# Patient Record
Sex: Male | Born: 1990 | Race: White | Hispanic: No | Marital: Single
Health system: Southern US, Community
[De-identification: ages and names within clinical notes are randomized; demographics above are authoritative.]

---

## 2018-09-06 ENCOUNTER — Other Ambulatory Visit: Payer: Self-pay | Admitting: Family Medicine

## 2018-09-06 ENCOUNTER — Other Ambulatory Visit: Payer: Self-pay

## 2018-09-06 ENCOUNTER — Ambulatory Visit
Admission: RE | Admit: 2018-09-06 | Discharge: 2018-09-06 | Disposition: A | Discharge status: Home / Self Care | Payer: BLUE CROSS/BLUE SHIELD | Source: Ambulatory Visit | Attending: Family Medicine | Admitting: Family Medicine

## 2018-09-06 DIAGNOSIS — R0602 Shortness of breath: Secondary | ICD-10-CM

## 2018-09-06 MED ORDER — IOPAMIDOL (ISOVUE-370) INJECTION 76%
75.0000 mL | Freq: Once | INTRAVENOUS | Status: AC | PRN
  Administered 2018-09-06: 75 mL via INTRAVENOUS

## 2019-09-07 ENCOUNTER — Ambulatory Visit: Payer: Self-pay | Attending: Internal Medicine

## 2019-09-07 DIAGNOSIS — Z23 Encounter for immunization: Secondary | ICD-10-CM

## 2019-09-07 NOTE — Progress Notes (Signed)
   Covid-19 Vaccination Clinic  Name:  Todd Rios    MRN: [id number] DOB: October 08, 1990  09/07/2019  [name] was observed post Covid-19 immunization for 15 minutes without incident. He was provided with Vaccine Information Sheet and instruction to access the V-Safe system.   [name] was instructed to call 911 with any severe reactions post vaccine: Marland Kitchen Difficulty breathing  . Swelling of face and throat  . A fast heartbeat  . A bad rash all over body  . Dizziness and weakness   Immunizations Administered    Name Date Dose VIS Date Route   Pfizer COVID-19 Vaccine 09/07/2019  5:09 PM 0.3 mL 05/26/2019 Intramuscular   Manufacturer: [employer], [employer]   Lot: [id number]   NDC: [address]-7

## 2019-10-02 ENCOUNTER — Ambulatory Visit: Payer: Self-pay | Attending: Internal Medicine

## 2019-10-02 ENCOUNTER — Ambulatory Visit: Payer: Self-pay

## 2019-10-02 DIAGNOSIS — Z23 Encounter for immunization: Secondary | ICD-10-CM

## 2019-10-02 NOTE — Progress Notes (Signed)
   Covid-19 Vaccination Clinic  Name:  Amedee Cerrone    MRN: [id number] DOB: [birth year]  10/02/2019  Mr. Ramanathan was observed post Covid-19 immunization for 15 minutes without incident. He was provided with Vaccine Information Sheet and instruction to access the V-Safe system.   Mr. Attar was instructed to call 911 with any severe reactions post vaccine: Marland Kitchen Difficulty breathing  . Swelling of face and throat  . A fast heartbeat  . A bad rash all over body  . Dizziness and weakness   Immunizations Administered    Name Date Dose VIS Date Route   Pfizer COVID-19 Vaccine 10/02/2019  8:20 AM 0.3 mL 08/09/2018 Intramuscular   Manufacturer: [employer], [employer]   Lot: [id number]   NDC: [address]-3

## 2019-11-16 IMAGING — CT CT ANGIOGRAPHY CHEST
4 of 8 series · 19 of 36 positions shown · IV contrast (iopamidol)
Comparison: None.

CLINICAL DATA: Shortness of breath, no fever or cough

EXAM:
CT ANGIOGRAPHY CHEST WITH CONTRAST
TECHNIQUE: Multidetector CT imaging of the chest was performed using the
standard protocol during bolus administration of intravenous
contrast. Multiplanar CT image reconstructions and MIPs were
obtained to evaluate the vascular anatomy.
CONTRAST:  75mL MZASXL-5RY IOPAMIDOL (MZASXL-5RY) INJECTION 76%

[Series 5: cta pulmonary 2.00 bv36 s3 ax · axial · 0.74mm/px · z∈[+1325,+1443]mm · 2 of 179 slices shown]
[im 60/179  lung]
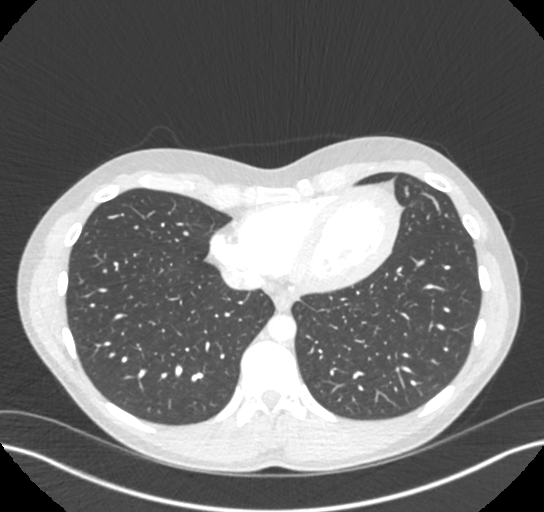
[im 119/179  lung]
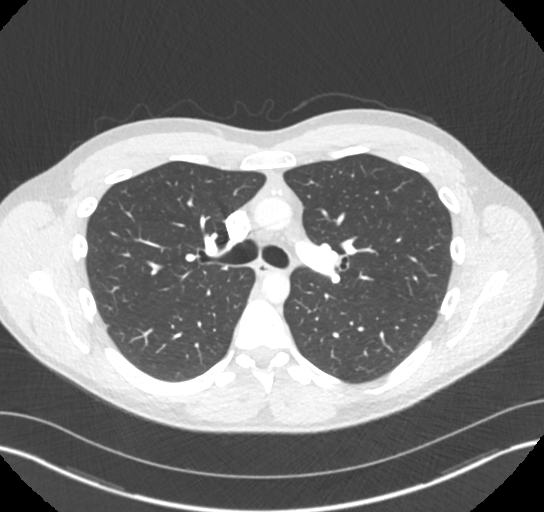

[Series 7: cta pulmonary 2.00 bv36 s3 cor · coronal · 0.70mm/px · 1 of 189 slices shown]
[im 95/189  mediastinal]
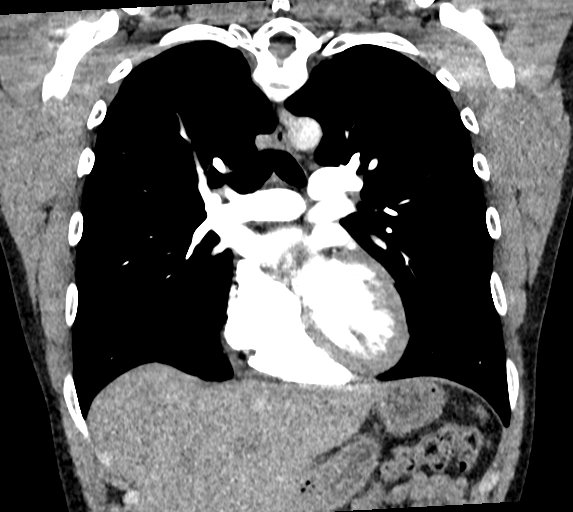

[Series 11: cta pulmonary 1.00 bv36 s3 thins · axial · 0.79mm/px · z∈[+1245,+1509]mm · 7 of 353 slices shown]
[im 45/353  lung]
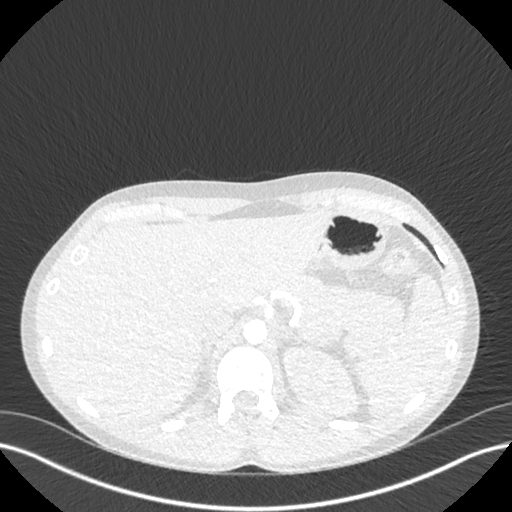
[im 89/353  lung]
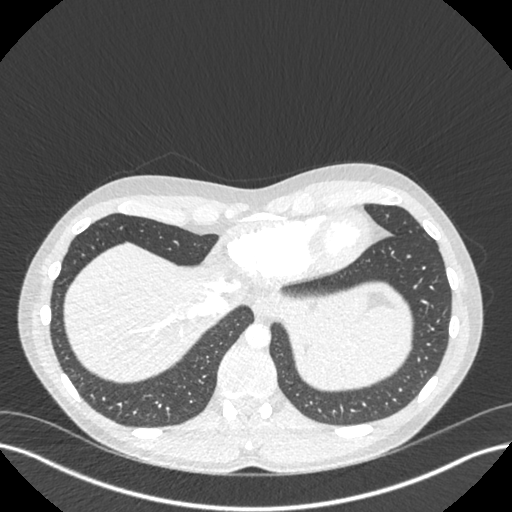
[im 133/353  lung]
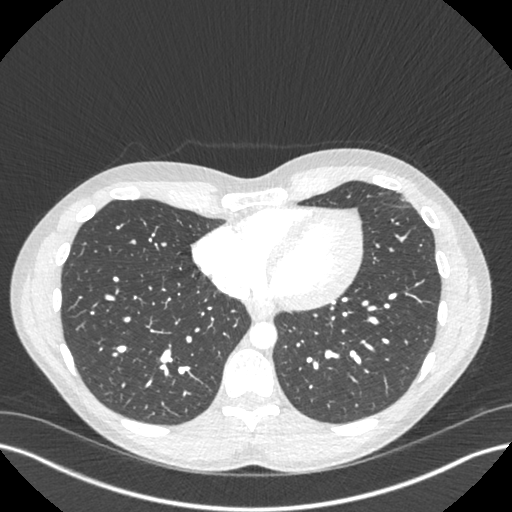
[im 177/353  lung]
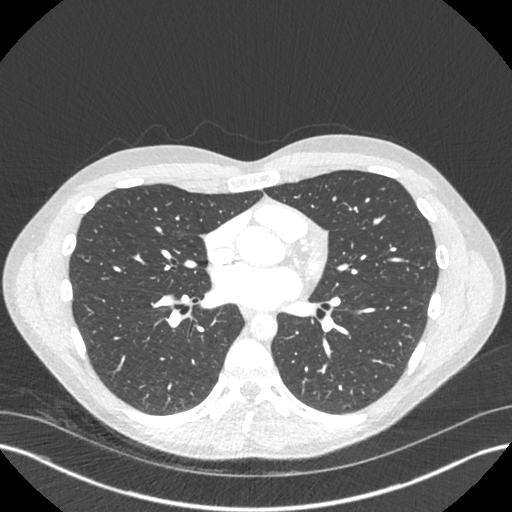
[im 221/353  lung]
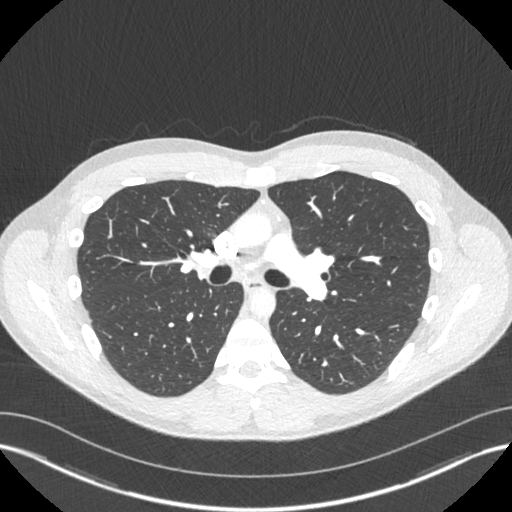
[im 265/353  lung]
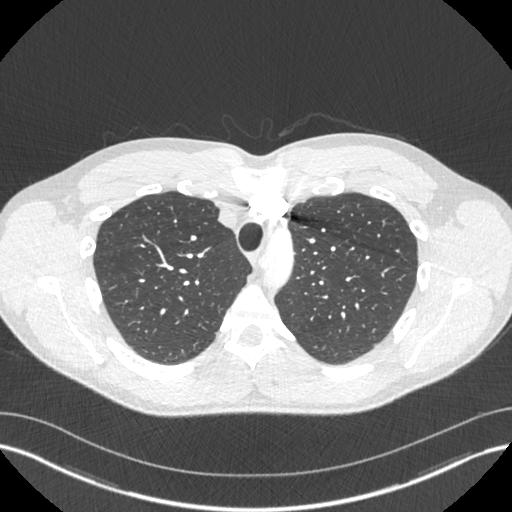
[im 309/353  lung]
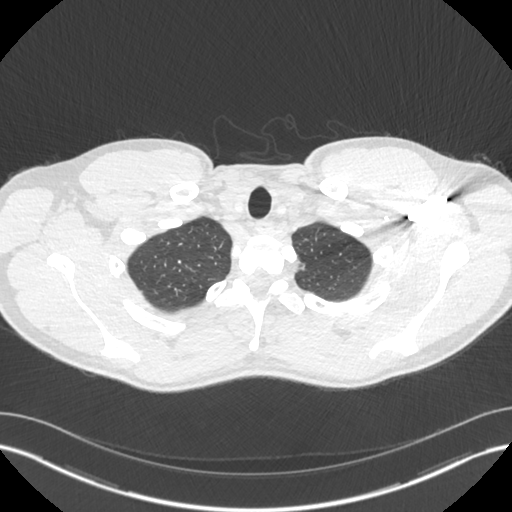

[Series 12: cta pulmonary 1.00 bv36 s3 super d. · axial · 0.78mm/px · z∈[+1236,+1515]mm · 9 of 436 slices shown]
[im 44/436  lung]
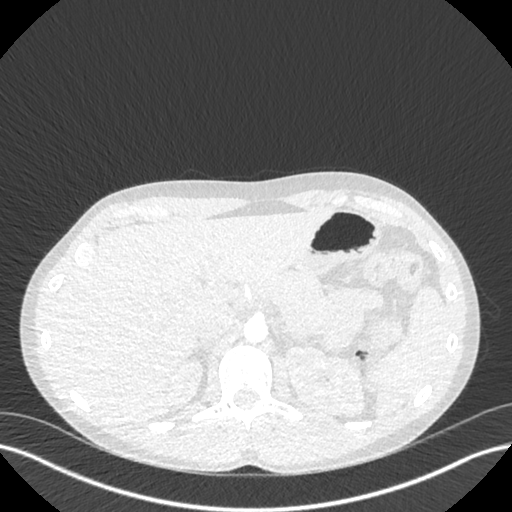
[im 88/436  mediastinal]
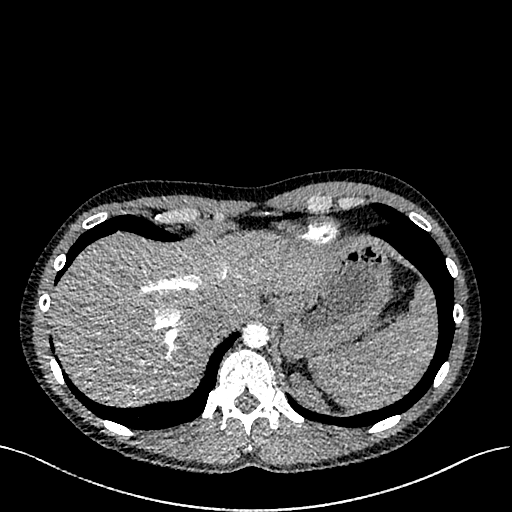
[im 131/436  lung]
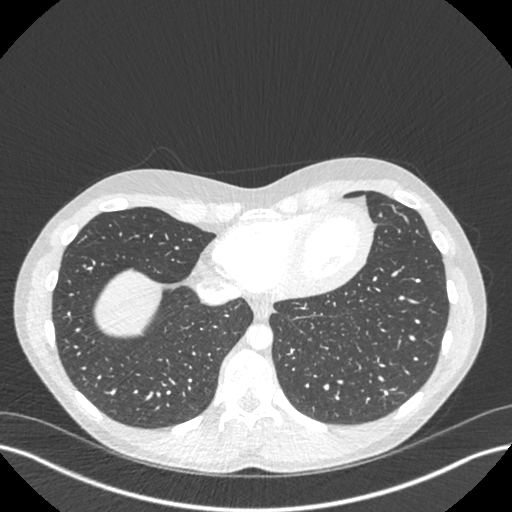
[im 175/436  mediastinal]
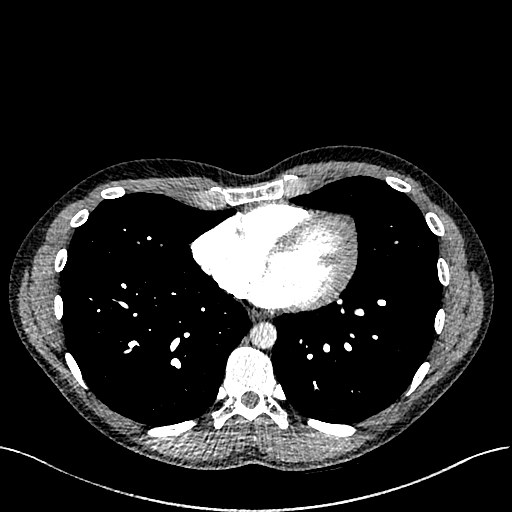
[im 218/436  lung]
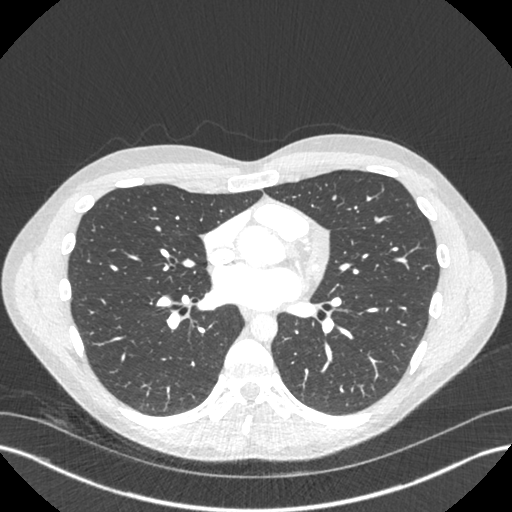
[im 262/436  mediastinal]
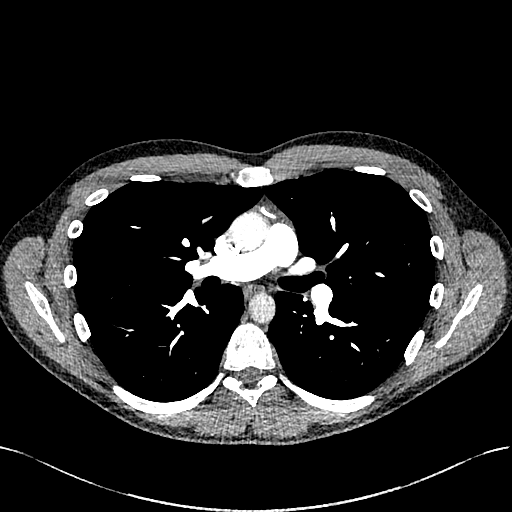
[im 305/436  lung]
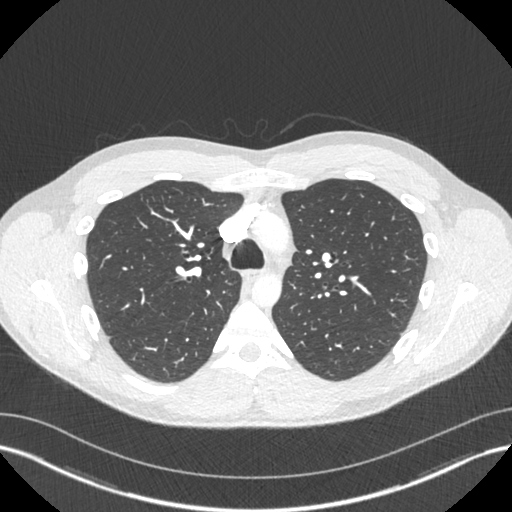
[im 349/436  mediastinal]
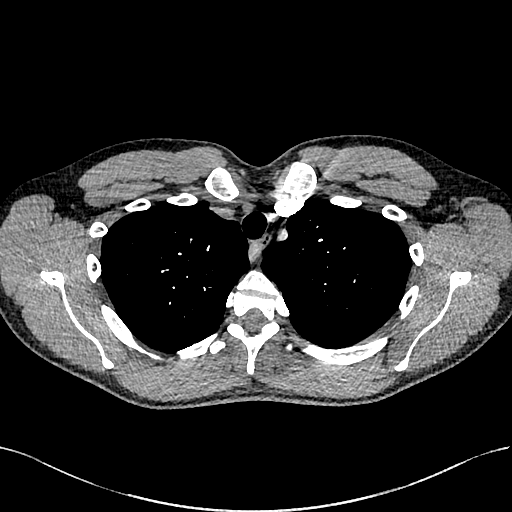
[im 392/436  lung]
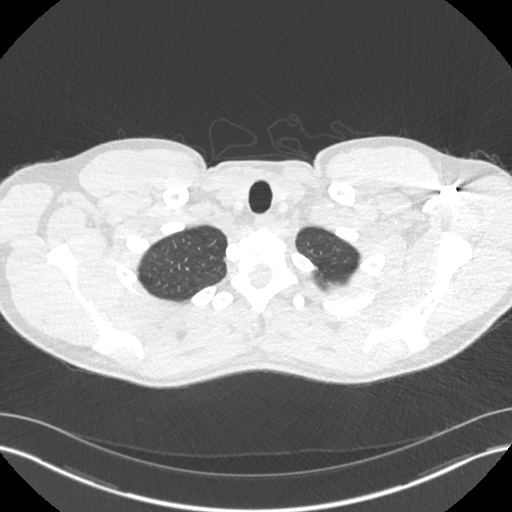

[19 of 36 positions shown; findings below may reference images not displayed]

FINDINGS: Cardiovascular: Heart size normal. No pericardial effusion.
Satisfactory opacification of pulmonary arteries noted, and there is
no evidence of pulmonary emboli. Adequate contrast opacification of
the thoracic aorta with no evidence of dissection, aneurysm, or
stenosis. There is an aberrant retroesophageal right subclavian
artery, an anatomic variant. Brachiocephalic arch anatomy without
proximal stenosis. No significant atheromatous change. Visualized
proximal abdominal aorta unremarkable.

Mediastinum/Nodes: No hilar or mediastinal adenopathy.

Lungs/Pleura: No pleural effusion. No pneumothorax. Lungs clear.

Upper Abdomen: No acute findings.

Musculoskeletal: No chest wall abnormality. No acute or significant
osseous findings.

Review of the MIP images confirms the above findings.
IMPRESSION: 1.  No acute PE or thoracic aortic dissection.
2. Aberrant right subclavian artery, an anatomic variant.

## 2020-03-04 ENCOUNTER — Other Ambulatory Visit: Payer: Self-pay

## 2020-05-20 ENCOUNTER — Ambulatory Visit: Payer: Self-pay | Attending: Critical Care Medicine

## 2020-05-20 DIAGNOSIS — Z23 Encounter for immunization: Secondary | ICD-10-CM

## 2020-05-20 NOTE — Progress Notes (Signed)
   Covid-19 Vaccination Clinic  Name:  Todd Rios    MRN: [id number] DOB: 10/19/1990  05/20/2020  Todd Rios was observed post Covid-19 immunization for 15 minutes without incident. He was provided with Vaccine Information Sheet and instruction to access the V-Safe system.   Todd Rios was instructed to call 911 with any severe reactions post vaccine: . Difficulty breathing  . Swelling of face and throat  . A fast heartbeat  . A bad rash all over body  . Dizziness and weakness   Immunizations Administered    Name Date Dose VIS Date Route   Pfizer COVID-19 Vaccine 05/20/2020  1:14 PM 0.3 mL 04/03/2020 Intramuscular   Manufacturer: [employer]   Lot: [id number]   NDC: [address]-2     

## 2020-05-20 NOTE — Progress Notes (Signed)
   Covid-19 Vaccination Clinic  Name:  Todd Rios    MRN: [id number] DOB: Jun 24, 1990  05/20/2020  [name] was observed post Covid-19 immunization for 15 minutes without incident. He was provided with Vaccine Information Sheet and instruction to access the V-Safe system.   [name] was instructed to call 911 with any severe reactions post vaccine: Marland Kitchen Difficulty breathing  . Swelling of face and throat  . A fast heartbeat  . A bad rash all over body  . Dizziness and weakness   Immunizations Administered    Name Date Dose VIS Date Route   Pfizer COVID-19 Vaccine 05/20/2020  1:14 PM 0.3 mL 04/03/2020 Intramuscular   Manufacturer: [employer], [employer]   Lot: [id number]   NDC: [address]-4

## 2021-05-15 ENCOUNTER — Other Ambulatory Visit: Payer: Self-pay | Admitting: Family

## 2021-05-15 ENCOUNTER — Other Ambulatory Visit: Payer: Self-pay | Admitting: Pathology

## 2021-05-15 ENCOUNTER — Other Ambulatory Visit (HOSPITAL_COMMUNITY): Payer: Self-pay | Admitting: Family

## 2021-05-15 DIAGNOSIS — K4090 Unilateral inguinal hernia, without obstruction or gangrene, not specified as recurrent: Secondary | ICD-10-CM

## 2021-05-23 ENCOUNTER — Ambulatory Visit (HOSPITAL_COMMUNITY): Payer: 59

## 2021-05-26 ENCOUNTER — Encounter (HOSPITAL_COMMUNITY): Payer: Self-pay

## 2021-05-26 ENCOUNTER — Other Ambulatory Visit (HOSPITAL_COMMUNITY): Payer: Self-pay

## 2021-05-27 ENCOUNTER — Other Ambulatory Visit: Payer: Self-pay | Admitting: Family

## 2021-05-27 DIAGNOSIS — K4090 Unilateral inguinal hernia, without obstruction or gangrene, not specified as recurrent: Secondary | ICD-10-CM

## 2021-05-28 ENCOUNTER — Ambulatory Visit
Admission: RE | Admit: 2021-05-28 | Discharge: 2021-05-28 | Disposition: A | Discharge status: Home / Self Care | Payer: 59 | Source: Ambulatory Visit | Attending: Family | Admitting: Family

## 2021-05-28 DIAGNOSIS — K4090 Unilateral inguinal hernia, without obstruction or gangrene, not specified as recurrent: Secondary | ICD-10-CM

## 2021-06-26 ENCOUNTER — Other Ambulatory Visit: Payer: Self-pay | Admitting: Family

## 2021-06-26 DIAGNOSIS — K4090 Unilateral inguinal hernia, without obstruction or gangrene, not specified as recurrent: Secondary | ICD-10-CM

## 2021-07-08 ENCOUNTER — Ambulatory Visit
Admission: RE | Admit: 2021-07-08 | Discharge: 2021-07-08 | Disposition: A | Discharge status: Home / Self Care | Payer: 59 | Source: Ambulatory Visit | Attending: Family | Admitting: Family

## 2021-07-08 ENCOUNTER — Other Ambulatory Visit: Payer: 59

## 2021-07-08 DIAGNOSIS — K4090 Unilateral inguinal hernia, without obstruction or gangrene, not specified as recurrent: Secondary | ICD-10-CM

## 2021-07-15 ENCOUNTER — Other Ambulatory Visit: Payer: Self-pay | Admitting: Family

## 2021-07-15 DIAGNOSIS — K4090 Unilateral inguinal hernia, without obstruction or gangrene, not specified as recurrent: Secondary | ICD-10-CM

## 2022-08-07 IMAGING — US US PELVIS LIMITED
1 series · 14 of 20 positions shown · non-contrast
Comparison: No prior cross-sectional imaging for comparison.

CLINICAL DATA: Soft tissue protrusion/bulge right inguinopelvic
area.

EXAM:
LIMITED ULTRASOUND OF PELVIS
TECHNIQUE: Limited transabdominal ultrasound examination of the pelvis was
performed. A curved abdominal transducer was utilized to assess the
area of palpable concern in the right inguinal-pelvic region.
Real-time imaging in multiple planes was performed with image
documentation.

[Series 1: us pelvis limited · 0.20mm/px · 20 acquisitions, 14 frames shown]
[im 1/20]
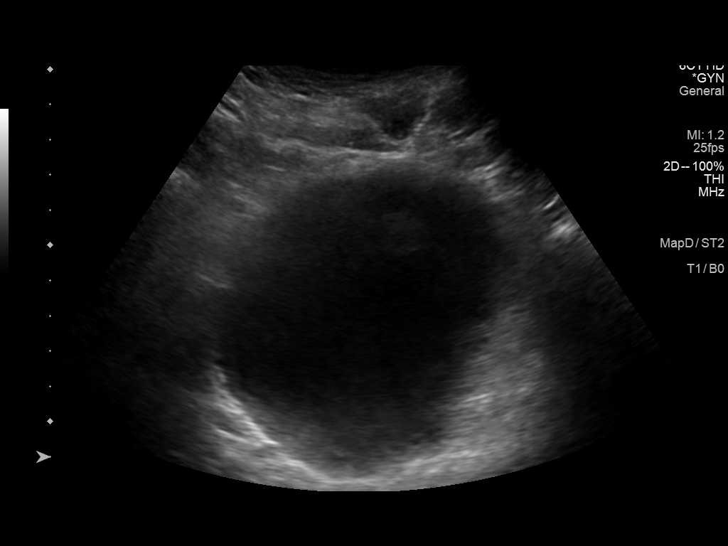
[im 3/20]
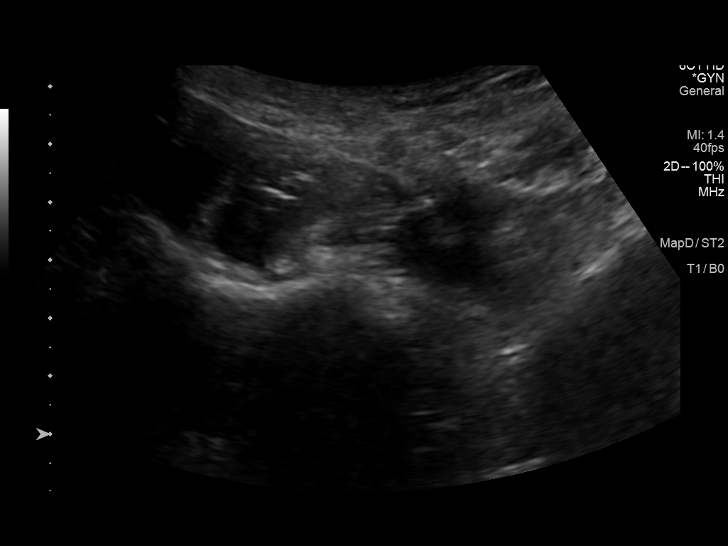
[im 4/20]
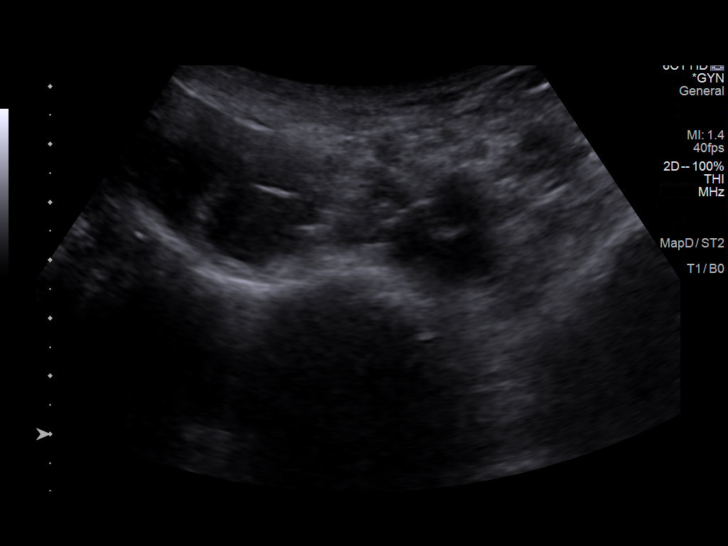
[im 6/20]
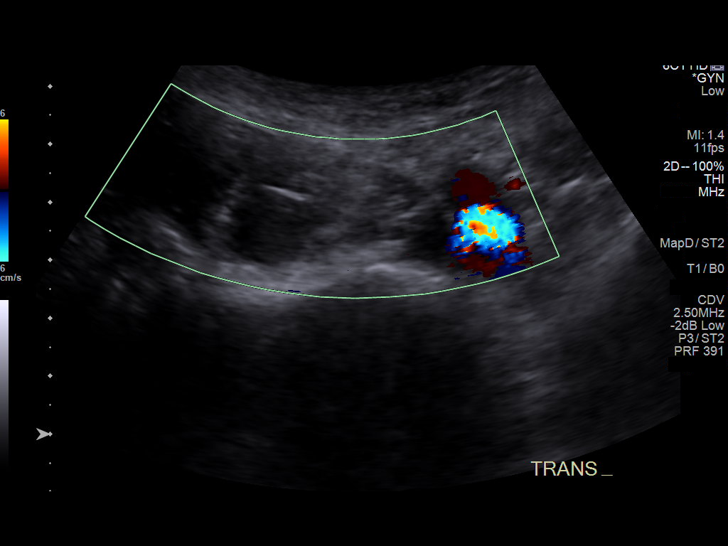
[im 7/20]
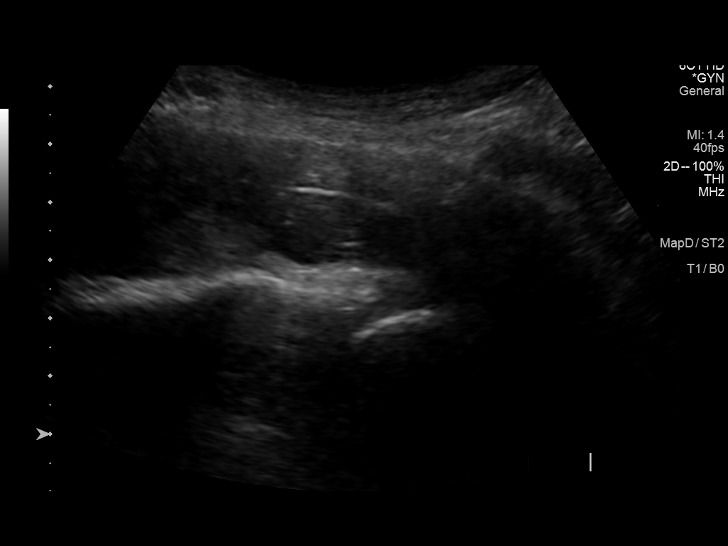
[im 8/20]
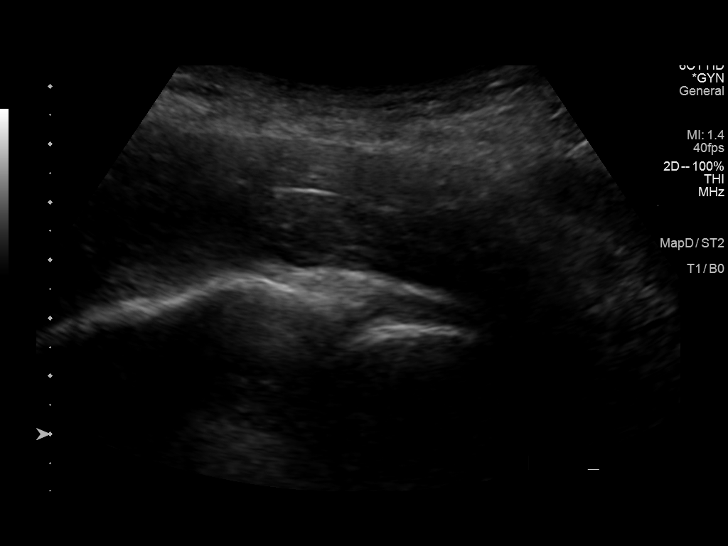
[im 10/20]
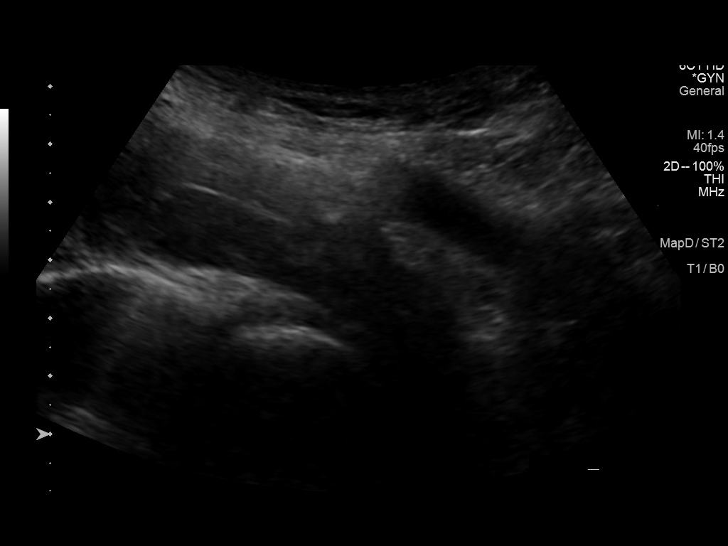
[im 11/20]
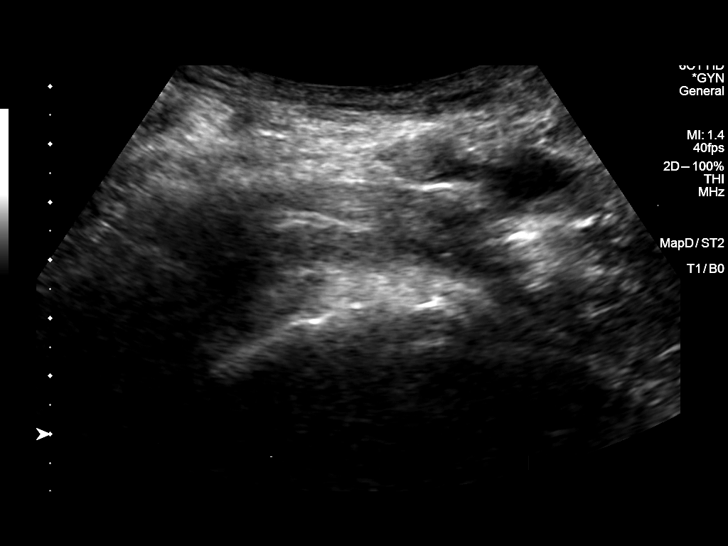
[im 13/20]
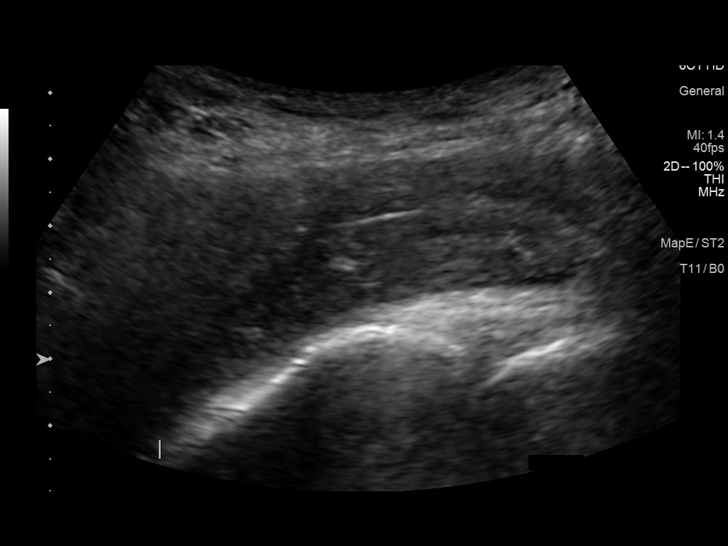
[im 14/20]
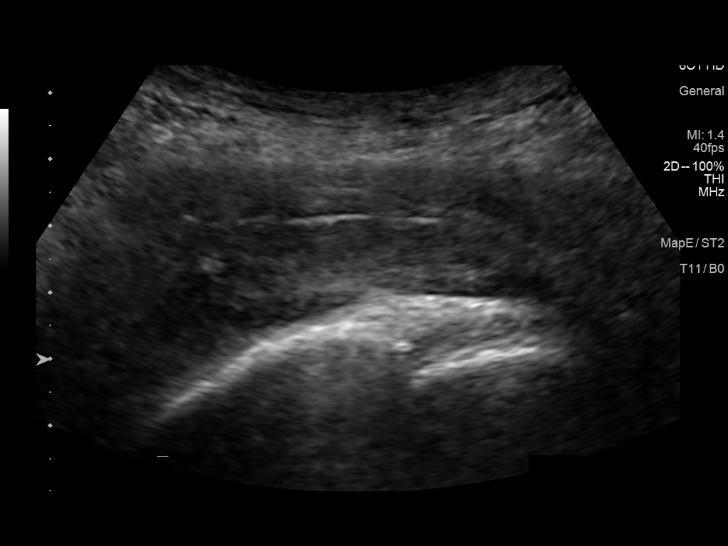
[im 16/20]
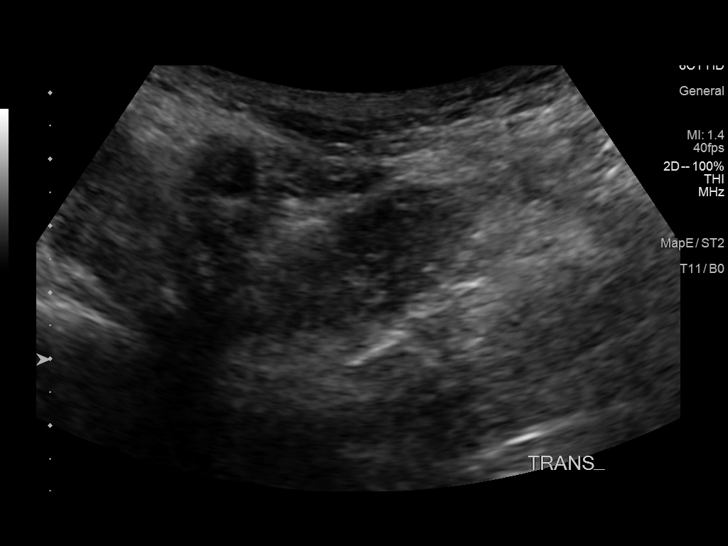
[im 17/20]
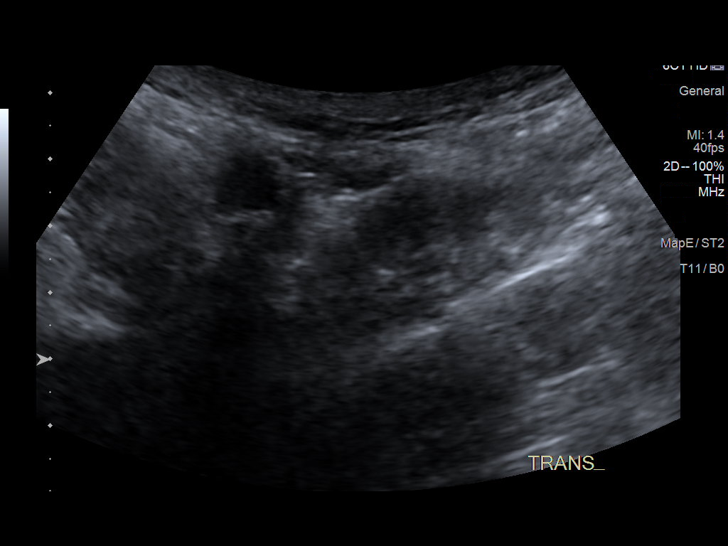
[im 18/20]
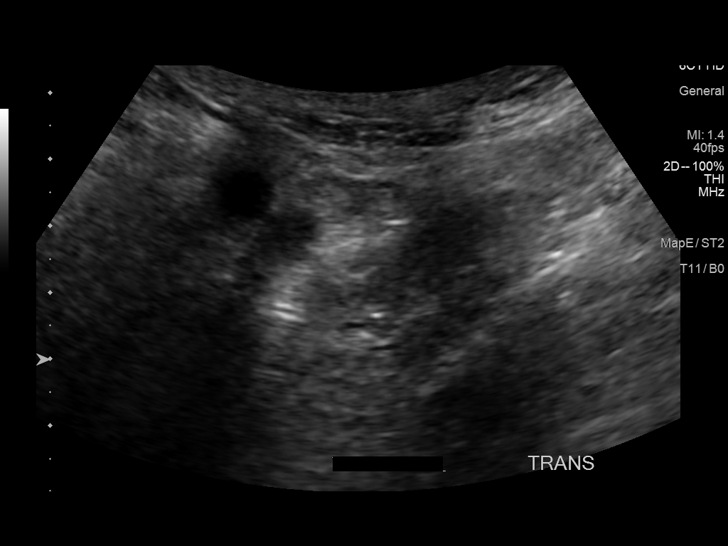
[im 20/20]
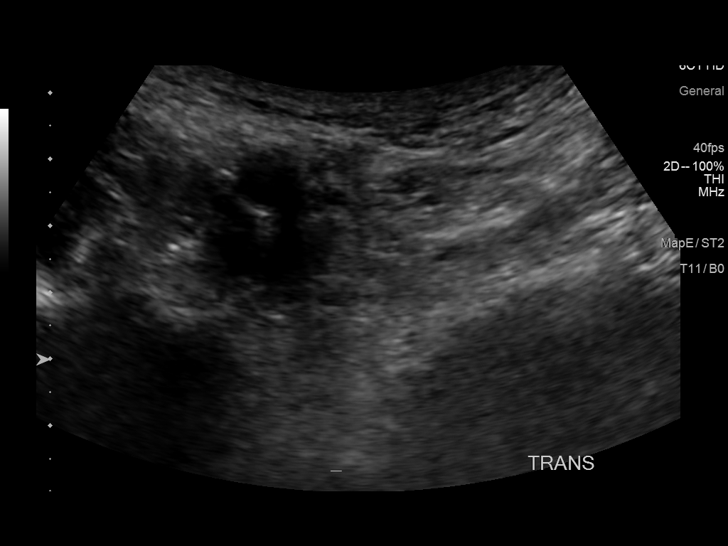

[14 of 20 positions shown; findings below may reference images not displayed]

FINDINGS: There is no sonographically visible inguinal hernia or femoral
hernia. Images were obtained prior to and following Valsalva
maneuver and no bowel was seen below the level of the inguinal
ligament. The bladder wall and lumen are unremarkable and no free
fluid is seen.
IMPRESSION: No sonographic correlate to the abnormality described above. There
is no visible inguinal or femoral hernia. CT may be helpful for
further evaluation.

## 2022-09-17 IMAGING — CT CT PELVIS W/O CM
1 series · 15 of 32 positions shown, 19 images · non-contrast
Comparison: None.

CLINICAL DATA: Right inguinal soft tissue mass, negative sonogram

EXAM:
CT PELVIS WITHOUT CONTRAST
TECHNIQUE: Multidetector CT imaging of the pelvis was performed following the
standard protocol without intravenous contrast.
RADIATION DOSE REDUCTION: This exam was performed according to the
departmental dose-optimization program which includes automated
exposure control, adjustment of the mA and/or kV according to
patient size and/or use of iterative reconstruction technique.

[Series 2: pelvis w/o 5.0 · axial · non-contrast · 0.91mm/px · z∈[-313,-53]mm · 15 of 58 slices shown, 19 images]
[im 4/58  soft-tissue]
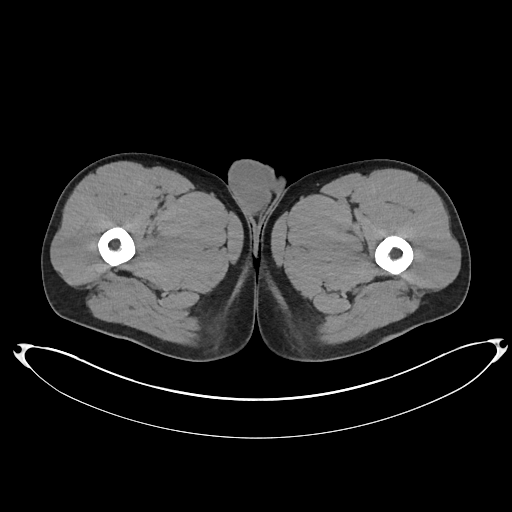
[im 4/58  bone]
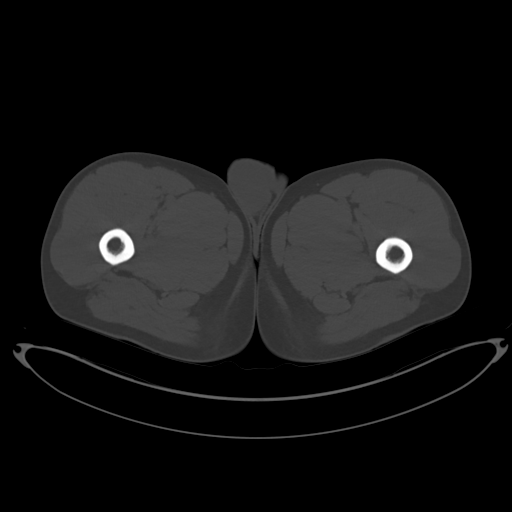
[im 8/58  soft-tissue]
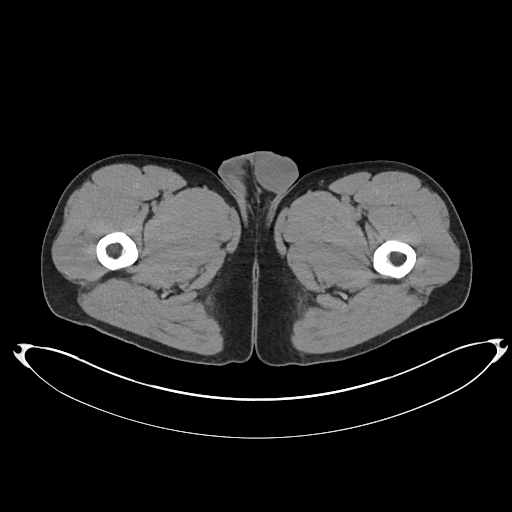
[im 12/58  soft-tissue]
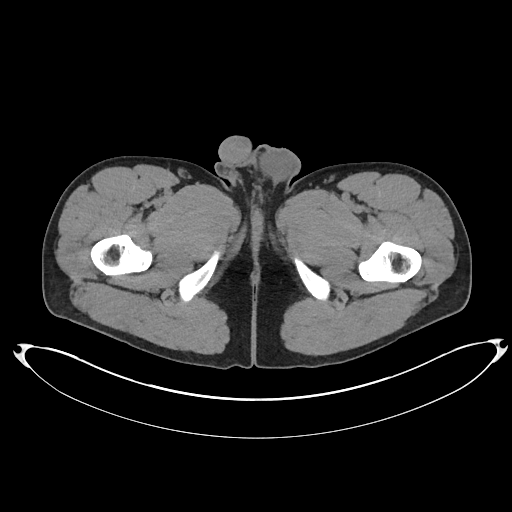
[im 17/58  soft-tissue]
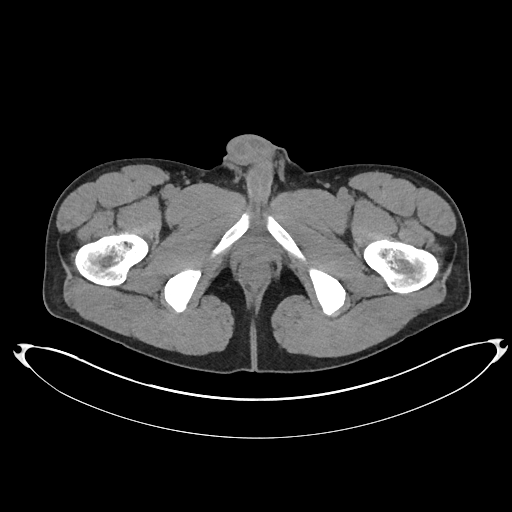
[im 21/58  soft-tissue]
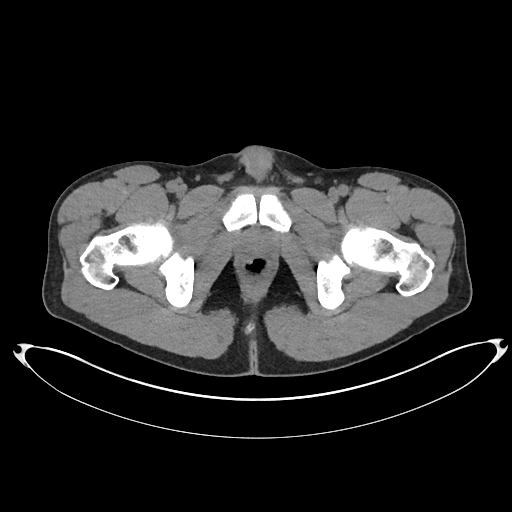
[im 24/58  soft-tissue]
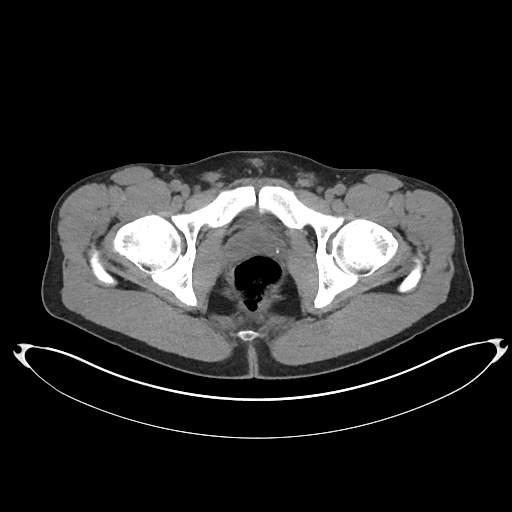
[im 30/58  soft-tissue]
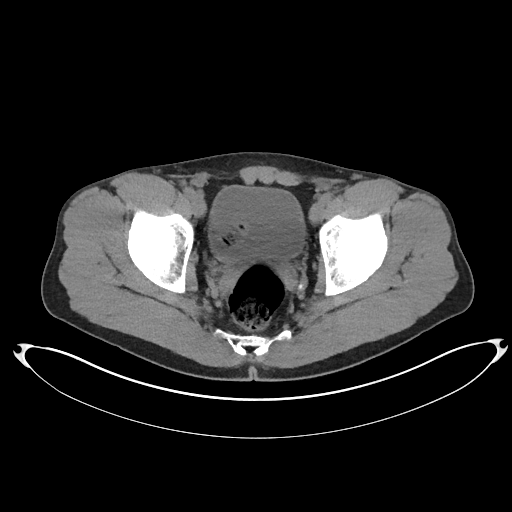
[im 34/58  soft-tissue]
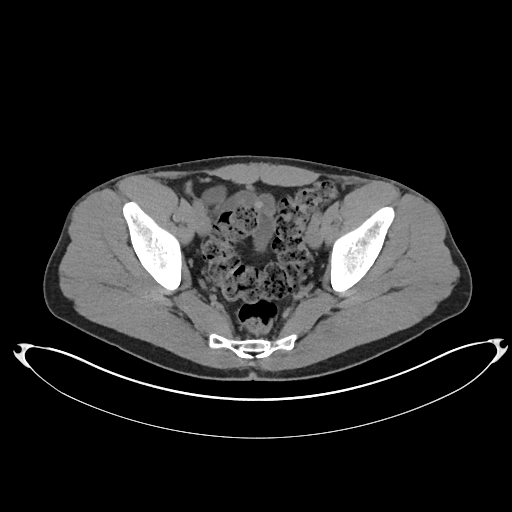
[im 37/58  soft-tissue]
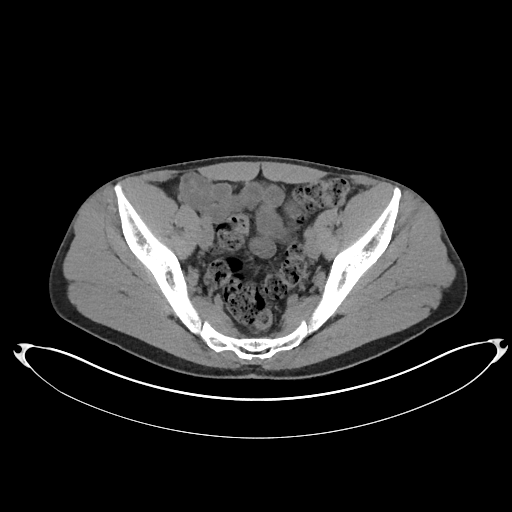
[im 37/58  bone]
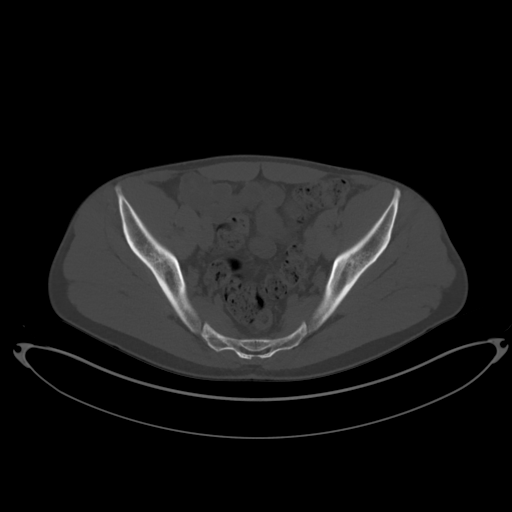
[im 41/58  soft-tissue]
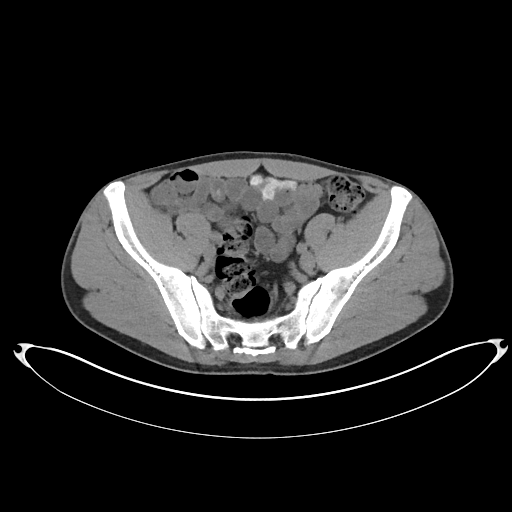
[im 46/58  soft-tissue]
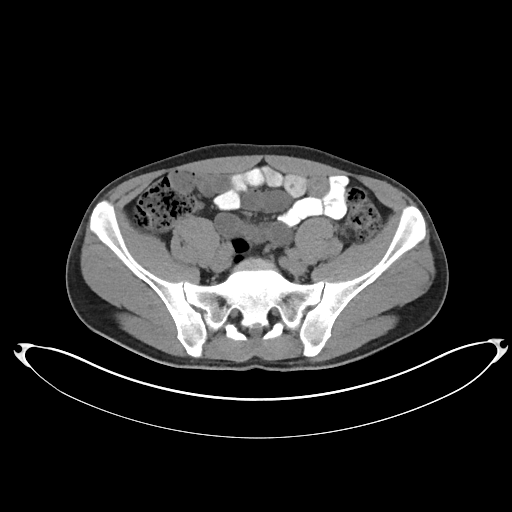
[im 50/58  soft-tissue]
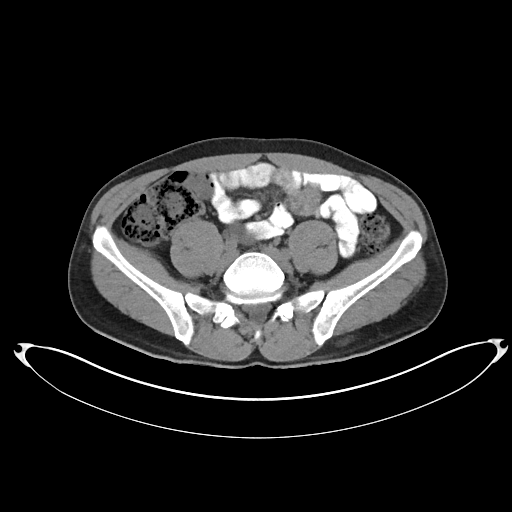
[im 50/58  lung]
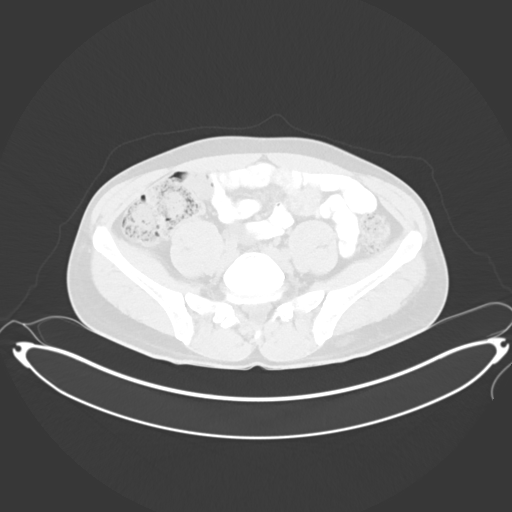
[im 52/58  lung]
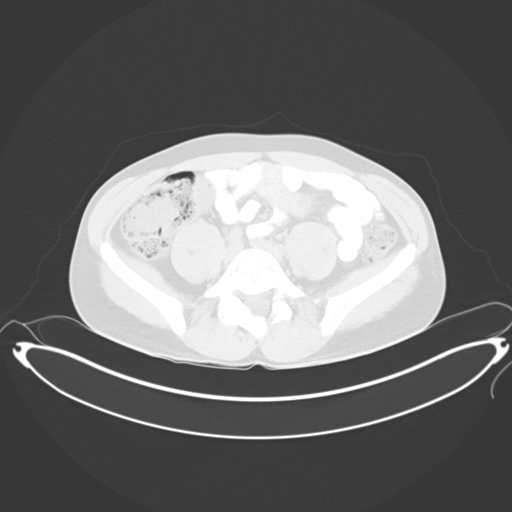
[im 54/58  soft-tissue]
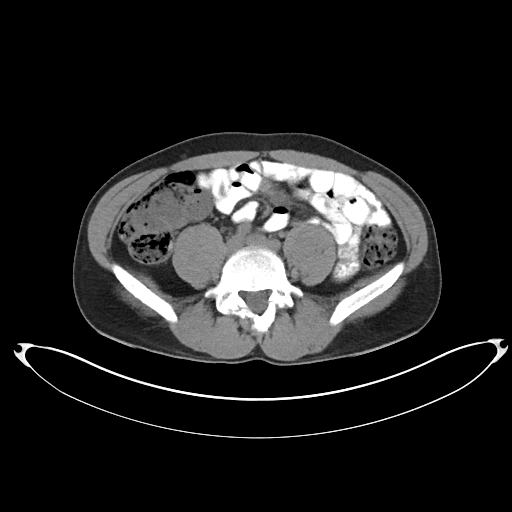
[im 54/58  lung]
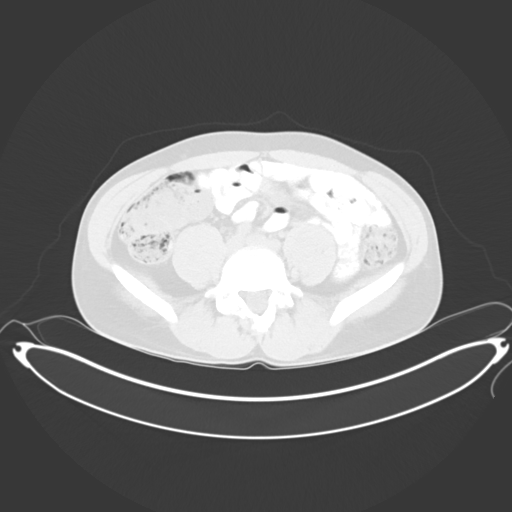
[im 56/58  lung]
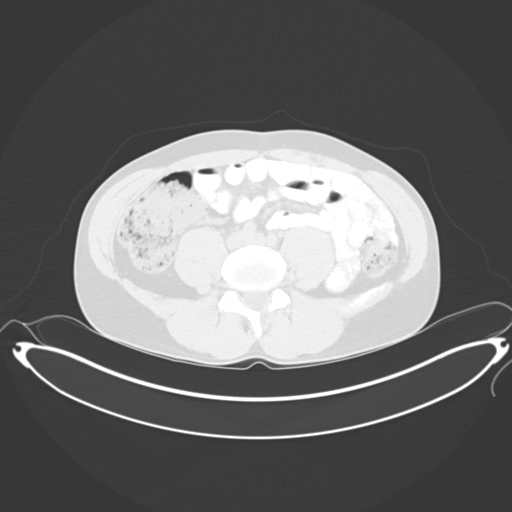

[15 of 32 positions shown; findings below may reference images not displayed]

FINDINGS: Urinary Tract:  No abnormality visualized.

Bowel: Moderate stool within the visualized colon and rectal vault.
Visualized bowel is otherwise unremarkable.

Vascular/Lymphatic: No pathologically enlarged lymph nodes. No
significant vascular abnormality seen.

Reproductive:  Prostate gland is unremarkable.

Other: No inguinal hernia identified. No abnormal subcutaneous soft
tissue mass, fluid collection, or calcification within the inguinal
regions.

Musculoskeletal: No acute bone abnormality. No lytic or blastic bone
lesion.
IMPRESSION: No abnormal subcutaneous soft tissue mass or hernia within the
inguinal regions. No correlate for the patient's reported palpable
abnormality.

Moderate stool
